# Patient Record
Sex: Female | Born: 1965 | Race: White | Hispanic: No | Marital: Single | Smoking: Never smoker
Health system: Southern US, Community
[De-identification: ages and names within clinical notes are randomized; demographics above are authoritative.]

---

## 2018-11-24 ENCOUNTER — Other Ambulatory Visit: Payer: Self-pay

## 2018-11-24 ENCOUNTER — Emergency Department (INDEPENDENT_AMBULATORY_CARE_PROVIDER_SITE_OTHER)
Admission: EM | Admit: 2018-11-24 | Discharge: 2018-11-24 | Disposition: A | Discharge status: Home / Self Care | Payer: 59 | Source: Home / Self Care | Attending: Emergency Medicine | Admitting: Emergency Medicine

## 2018-11-24 ENCOUNTER — Emergency Department (INDEPENDENT_AMBULATORY_CARE_PROVIDER_SITE_OTHER): Payer: 59

## 2018-11-24 DIAGNOSIS — S93422A Sprain of deltoid ligament of left ankle, initial encounter: Secondary | ICD-10-CM | POA: Diagnosis not present

## 2018-11-24 DIAGNOSIS — M7732 Calcaneal spur, left foot: Secondary | ICD-10-CM

## 2018-11-24 MED ORDER — IBUPROFEN 200 MG PO TABS: ORAL_TABLET | ORAL | 0 refills | Status: AC

## 2018-11-24 NOTE — ED Triage Notes (Signed)
Pt c/o LT foot and ankle pain x 1 mos. Denies known injury. Says she bought some new shoes a few months ago and that's when the pain started. She stopped wearing said shoes but pain never went away. Describes as shooting pain, and notes some swelling. Tried Ice prn with little relief.

## 2018-11-24 NOTE — Discharge Instructions (Signed)
Today, x-ray left ankle is within normal limits.  No fracture or dislocation.   Please read attached instruction sheet on ankle sprain and ankle rehab exercises. May try alternating ice and heat and elevation to see if that helps. May use ibuprofen OTC, 3 every 8 hours with food if needed for pain.  Do not use for over 1 week unless advised by physician. Separate note written for work, no excessive walking or any climbing at work for 2 weeks. Follow-up with Donna Hubbard or any other sports medicine specialist if not better in 1 to 2 weeks.

## 2018-11-24 NOTE — ED Provider Notes (Signed)
[facility]    CSN: [id number] Arrival date & time: 11/24/18  1001     History   Chief Complaint Chief Complaint  Patient presents with  . Ankle Pain    LT    HPI Donna Hubbard is a 53 y.o. female.    Ankle Pain Location:  Ankle Injury: no (Recalls no specific injury)   Ankle location:  L ankle (MEDIAL aspect) Pain details:    Quality:  Aching and sharp   Radiates to:  Does not radiate   Severity:  Moderate   Onset quality:  Unable to specify (She thinks it started after wearing new boots over a month ago, but she has since discontinued the boots.)   Duration:  1 month   Timing:  Constant   Progression:  Waxing and waning Chronicity:  New Prior injury to area:  No Relieved by:  Rest Worsened by:  Bearing weight and rotation (Eversion of left ankle.  Also worsened by standing) Ineffective treatments:  Ice (Ice helped a little) Associated symptoms: decreased ROM and swelling (Especially after standing or movement of left ankle)   Associated symptoms: no back pain, no fatigue, no fever, no itching, no muscle weakness, no neck pain, no numbness, no stiffness and no tingling   Risk factors: no recent illness   Risk factors comment:  She states she has history of osteoporosis, but no fractures  She denies left foot pain.  No plantar pain.  Denies left calf or knee pain.   No problems with right lower extremity.  She works as a [employer] architect[employer]ruck driver, denies significant pain while sitting. She has been able to work throughout all symptoms.   History reviewed. No pertinent past medical history. Past medical history otherwise negative for chronic disease. There are no active problems to display for this patient.   History reviewed. No pertinent surgical history.  OB History   No obstetric history on file.    She denies chance of pregnancy.  Status post menopause  Home Medications    Prior to Admission medications   Medication Sig Start Date End Date Taking?  Authorizing Provider  loratadine (CLARITIN) 10 MG tablet Take 10 mg by mouth as needed for allergies.   Yes [provider]  ibuprofen (ADVIL) 200 MG tablet Take three tablets ( 600 milligrams total) every 6 with food as needed for pain. 11/24/18   Lajean ManesMassey, David, MD    Family History History reviewed. No pertinent family history.  Social History Social History   Tobacco Use  . Smoking status: Never Smoker  . Smokeless tobacco: Never Used  Substance Use Topics  . Alcohol use: Yes    Comment: occ  . Drug use: Never     Allergies   Patient has no known allergies.   Review of Systems Review of Systems  Constitutional: Negative for fatigue and fever.  Musculoskeletal: Negative for back pain, neck pain and stiffness.  Skin: Negative for itching.  All other systems reviewed and are negative.  Pertinent items noted in HPI and remainder of comprehensive ROS otherwise negative.   Physical Exam Triage Vital Signs ED Triage Vitals  Enc Vitals Group     BP      Pulse      Resp      Temp      Temp src      SpO2      Weight      Height      Head Circumference  Peak Flow      Pain Score      Pain Loc      Pain Edu?      Excl. in GC?    No data found.  Updated Vital Signs BP 129/83 (BP Location: Right Arm)   Pulse 81   Temp (!) 97.5 F (36.4 C) (Oral)   Resp 18   Ht $RemoveBefo [id number]$5\' 7"O2 97%   BMI 36.02 kg/m    Physical Exam Vitals signs reviewed.  Constitutional:      General: She is not in acute distress.    Appearance: She is well-developed. She is not ill-appearing or toxic-appearing.     Comments: Pleasant female.  Mildly overweight.  No acute distress.  She can weight-bear but slightly favors left ankle.  HENT:     Head: Normocephalic and atraumatic.  Eyes:     General: No scleral icterus.    Pupils: Pupils are equal, round, and reactive to light.  Neck:     Musculoskeletal: Normal range of motion  and neck supple.  Cardiovascular:     Rate and Rhythm: Normal rate and regular rhythm.  Pulmonary:     Effort: Pulmonary effort is normal.  Abdominal:     General: There is no distension.  Musculoskeletal:     Left ankle: She exhibits decreased range of motion and swelling (Minimal, medial aspect). She exhibits no ecchymosis, no deformity, no laceration and normal pulse. Tenderness. Medial malleolus tenderness found. No AITFL, no posterior TFL, no head of 5th metatarsal and no proximal fibula tenderness found. Achilles tendon normal.     Left foot: Normal. Normal range of motion and normal capillary refill. No tenderness, bony tenderness, swelling or deformity.       Feet:     Comments: Left calf and lower leg: No tenderness or swelling or cords or pitting edema.  Feet:     Right foot:     Skin integrity: Skin integrity normal.  Skin:    General: Skin is warm and dry.     Capillary Refill: Capillary refill takes less than 2 seconds.     Findings: No rash.  Neurological:     Mental Status: She is alert and oriented to person, place, and time.     Cranial Nerves: No cranial nerve deficit.     Sensory: No sensory deficit.  Psychiatric:        Mood and Affect: Mood normal.        Behavior: Behavior normal.      UC Treatments / Results  Labs (all labs ordered are listed, but only abnormal results are displayed) Labs Reviewed - No data to display     Radiology Dg Ankle Complete Left  Result Date: 11/24/2018 CLINICAL DATA:  Pain EXAM: LEFT ANKLE COMPLETE - 3+ VIEW COMPARISON:  None. FINDINGS: Frontal, oblique, and lateral views were obtained. There is no evident fracture or joint effusion. There is no joint space narrowing or erosion. Ankle mortise appears intact. There is an inferior calcaneal spur. IMPRESSION: Inferior calcaneal spur. No fracture or appreciable arthropathy. Ankle mortise appears. Electronically Signed   By: Bretta BangWilliam  Woodruff III M.D.   On: 11/24/2018 10:50     Procedures Procedures (including critical care time)  Medications Ordered in UC Medications - No data to display  Initial Impression / Assessment and Plan / UC Course  I have reviewed the triage vital signs and the  nursing notes.  Pertinent labs & imaging results that were available during my care of the patient were reviewed by me and considered in my medical decision making (see chart for details).    Discussed with patient.  No fracture or dislocation.  Incidental calcaneal spur, of no clinical significance, she is nontender and has no pain over her heel. Explained her diagnosis likely is medial left ankle sprain. 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 At her request, I wrote a note limiting walking or climbing for 2 weeks.   Left ankle brace supplied today. Other modalities discussed.  See instructions and AVS. Advised ibuprofen, take 600 mg every 8-12 hours with food as needed for pain.  Do not use for over 1 week.  Precautions discussed. Follow-up with sports medicine if no better in 1 to 2 weeks, sooner if worse or new symptoms.  She voiced understanding.  Final Clinical Impressions(s) / UC Diagnoses   Final diagnoses:  Sprain of deltoid ligament of left ankle, initial encounter     Discharge Instructions     Today, x-ray left ankle is within normal limits.  No fracture or dislocation.   Please read attached instruction sheet on ankle sprain and ankle rehab exercises. May try alternating ice and heat and elevation to see if that helps. May use ibuprofen OTC, 3 every 8 hours with food if needed for pain.  Do not use for over 1 week unless advised by physician. Separate note written for work, no excessive walking or any climbing at work for 2 weeks. Follow-up with Dr. Georgina Snell or any other sports medicine specialist if not better in 1 to 2 weeks.     ED Prescriptions    Medication Sig Dispense Auth. Provider   ibuprofen (ADVIL) 200 MG tablet Take three tablets ( 600 milligrams total) every 6 with food as needed for pain. 30 tablet Jacqulyn Cane, MD     Controlled Substance Prescriptions [place] Controlled Substance Registry consulted? Not Applicable   Jacqulyn Cane, MD 11/24/18 1746

## 2020-07-19 IMAGING — DX LEFT ANKLE COMPLETE - 3+ VIEW
3 series · 3 of 3 positions shown · non-contrast
Comparison: None.

CLINICAL DATA: Pain

EXAM:
LEFT ANKLE COMPLETE - 3+ VIEW

[ankle ap]
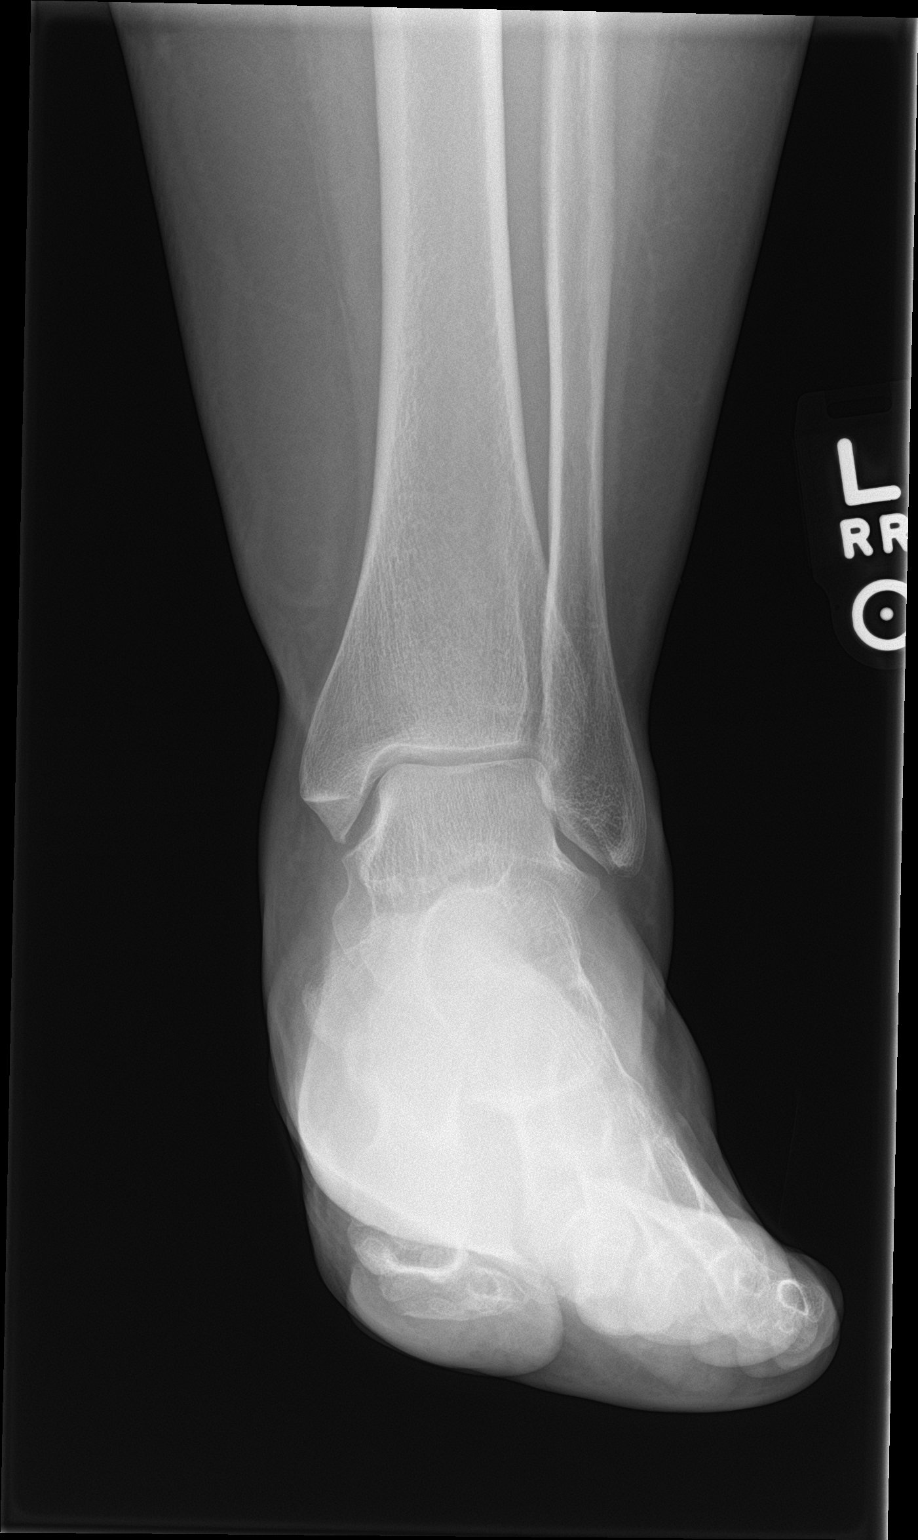

[ankle obl]
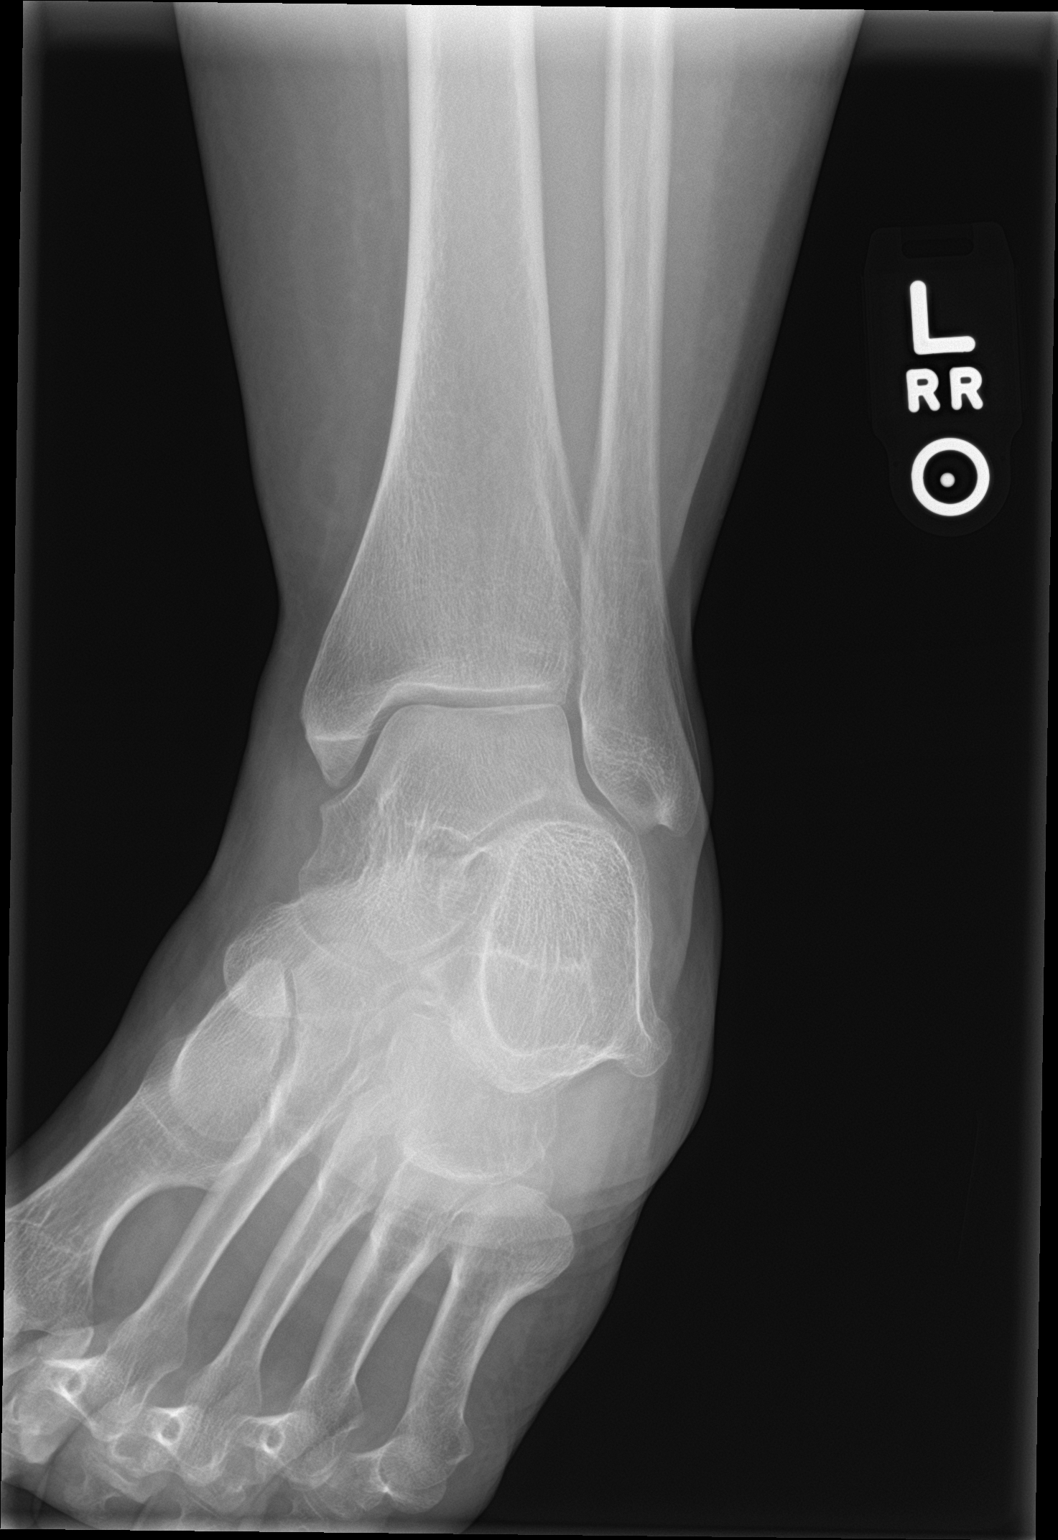

[ankle lat]
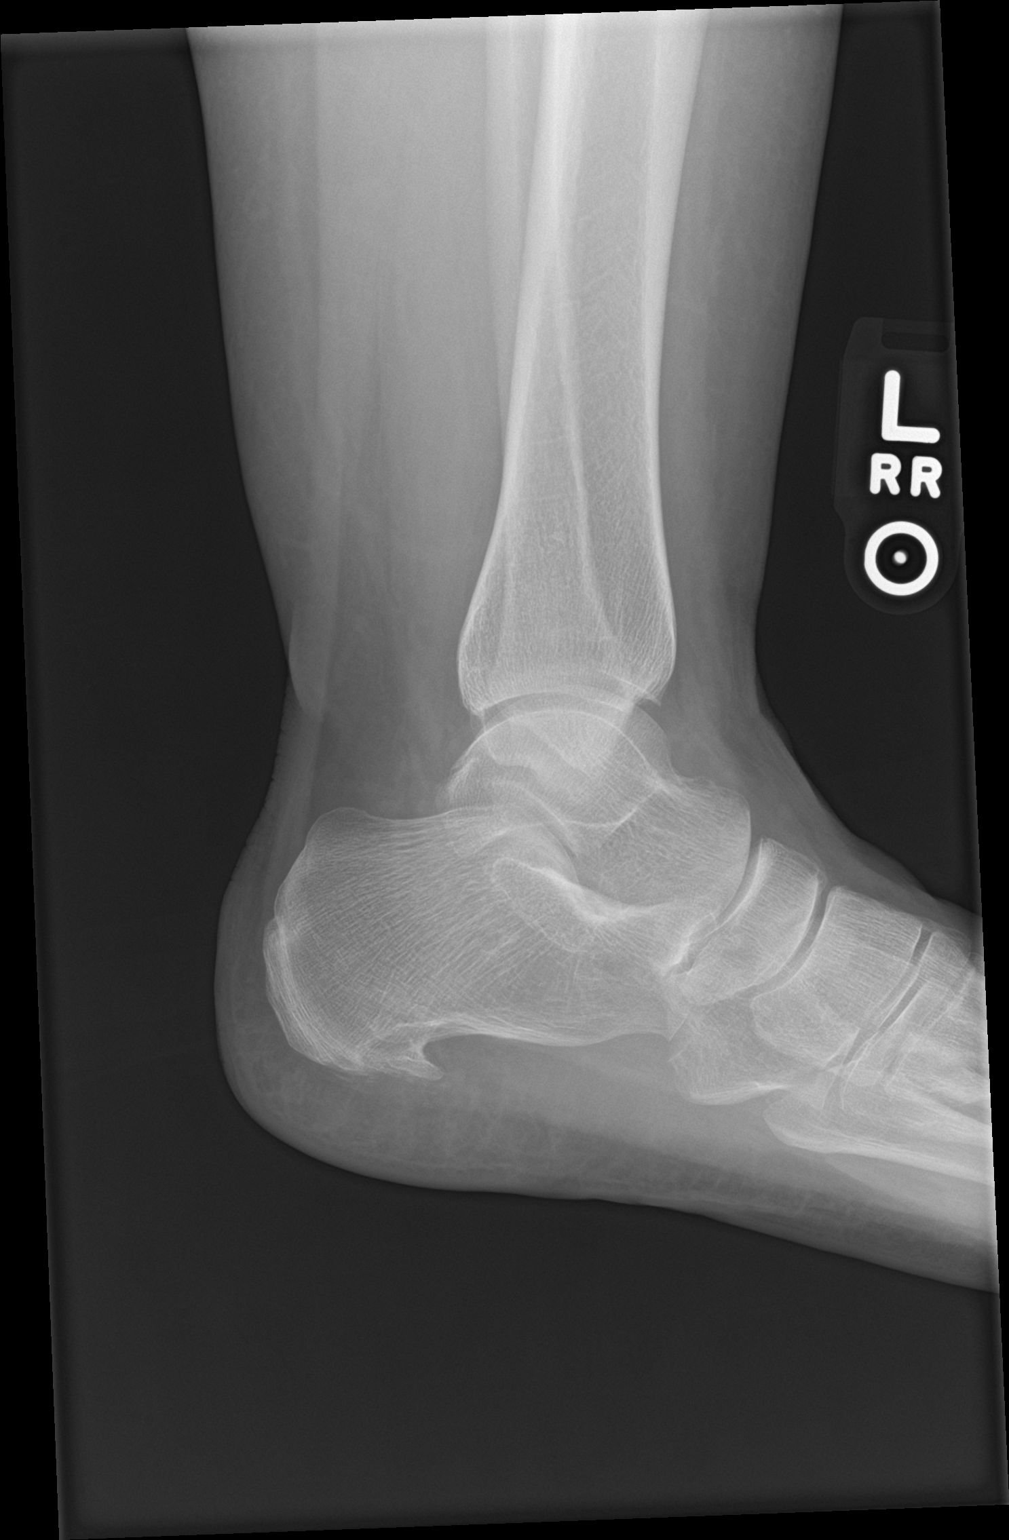

[3 of 3 positions shown; findings below may reference images not displayed]

FINDINGS: Frontal, oblique, and lateral views were obtained. There is no
evident fracture or joint effusion. There is no joint space
narrowing or erosion. Ankle mortise appears intact. There is an
inferior calcaneal spur.
IMPRESSION: Inferior calcaneal spur. No fracture or appreciable arthropathy.
Ankle mortise appears.
